# Patient Record
Sex: Female | Born: 1975 | Race: White | Hispanic: No | Marital: Single | Smoking: Current every day smoker
Health system: Southern US, Community
[De-identification: ages and names within clinical notes are randomized; demographics above are authoritative.]

## PROBLEM LIST (undated history)

## (undated) DIAGNOSIS — I10 Essential (primary) hypertension: Secondary | ICD-10-CM

## (undated) DIAGNOSIS — R569 Unspecified convulsions: Secondary | ICD-10-CM

---

## 2005-12-14 ENCOUNTER — Emergency Department: Payer: Self-pay | Admitting: Emergency Medicine

## 2007-01-16 ENCOUNTER — Emergency Department: Payer: Self-pay | Admitting: Emergency Medicine

## 2008-10-06 ENCOUNTER — Encounter: Payer: Self-pay | Admitting: Obstetrics and Gynecology

## 2008-10-14 ENCOUNTER — Inpatient Hospital Stay: Payer: Self-pay | Admitting: Obstetrics and Gynecology

## 2008-12-06 ENCOUNTER — Emergency Department: Payer: Self-pay | Admitting: Emergency Medicine

## 2010-10-30 IMAGING — US US OB DETAIL+14 WK - NRPT [facility]
1 series · 14 of 28 positions shown · non-contrast
Comparison: none

[Series 1: us ob detail+14 wk - nrpt [facility] · 14 of 91 slices shown]
[im 4/91]
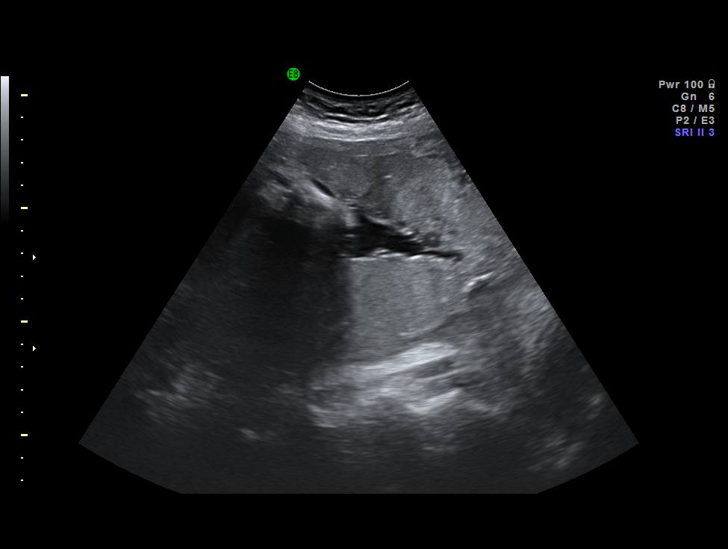
[im 11/91]
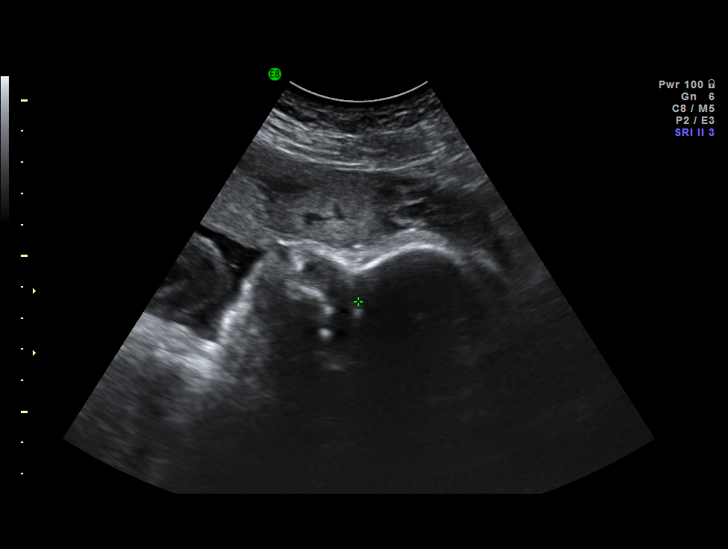
[im 17/91]
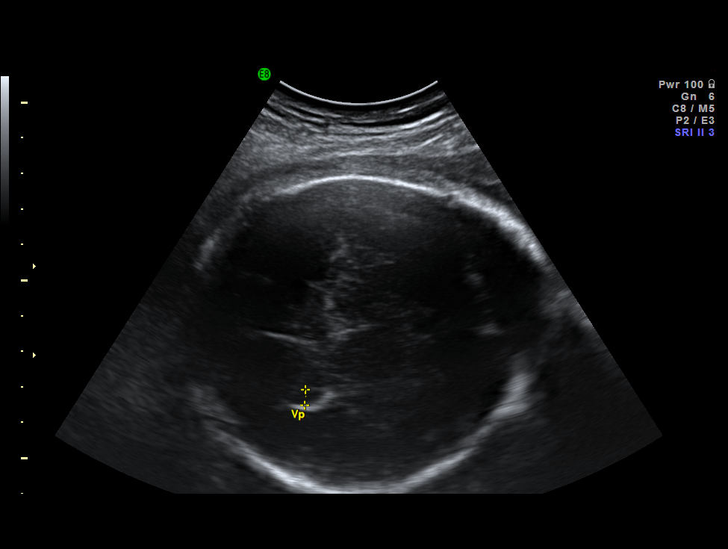
[im 24/91]
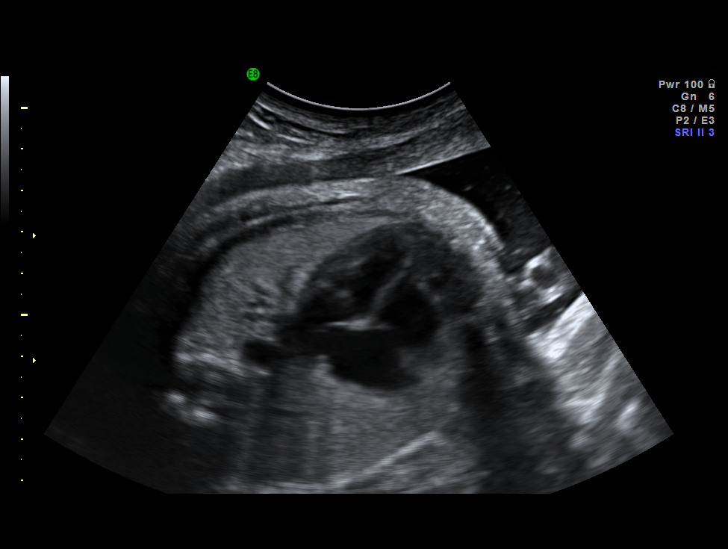
[im 31/91]
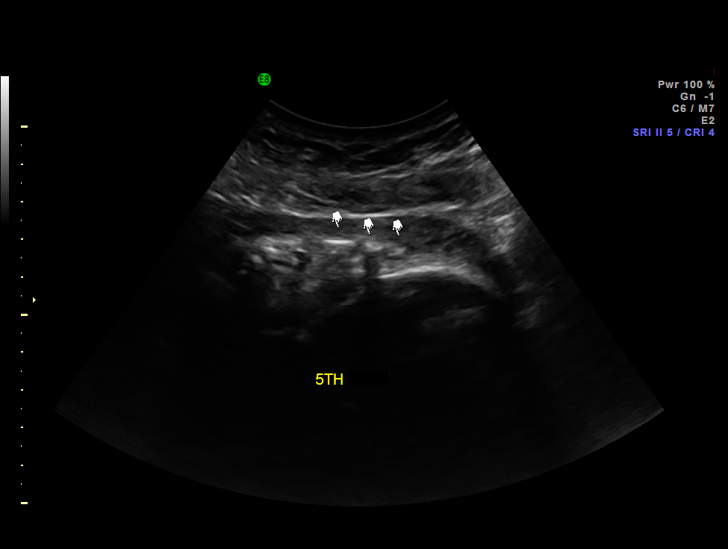
[im 37/91]
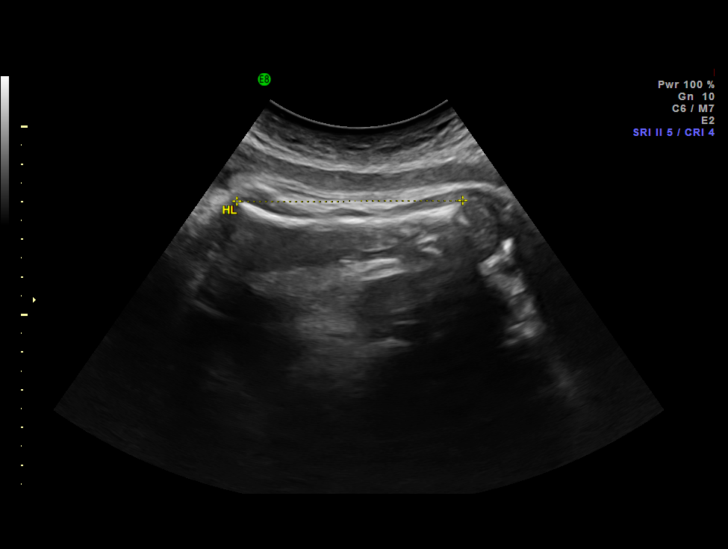
[im 44/91]
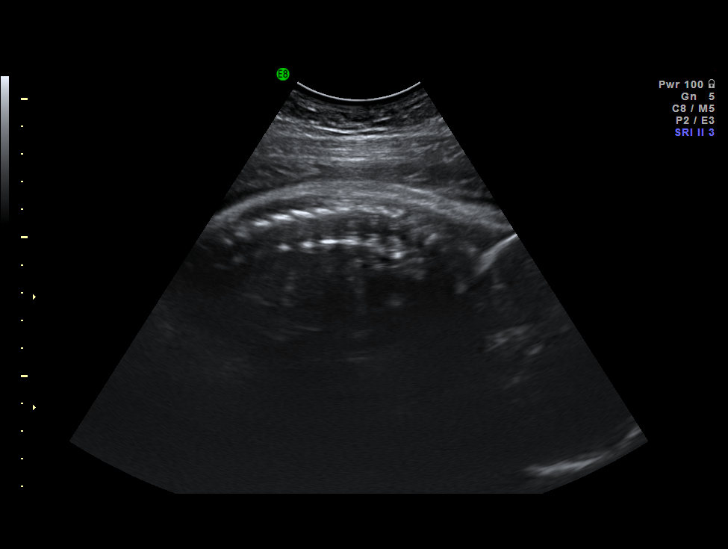
[im 51/91]
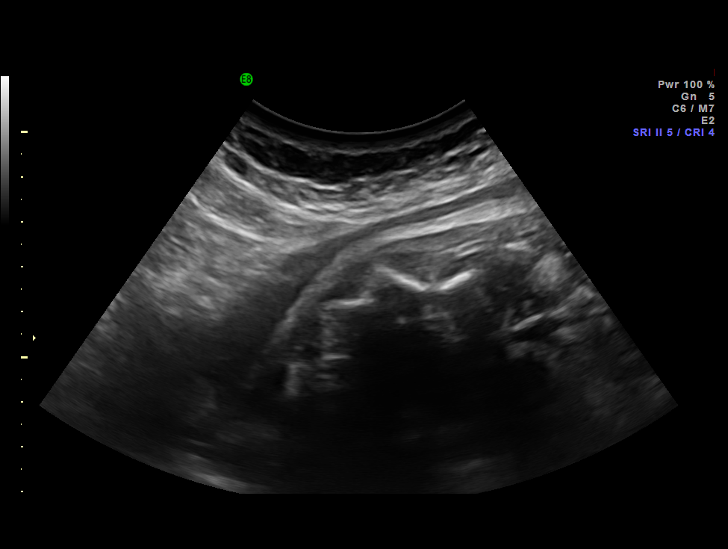
[im 57/91]
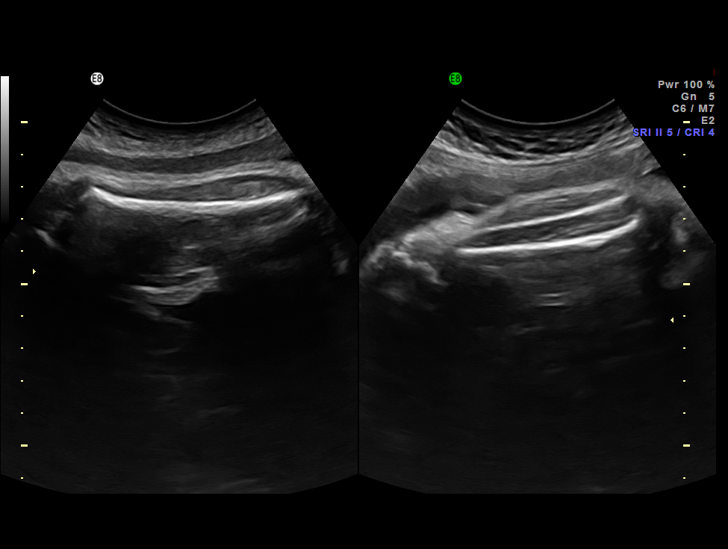
[im 64/91]
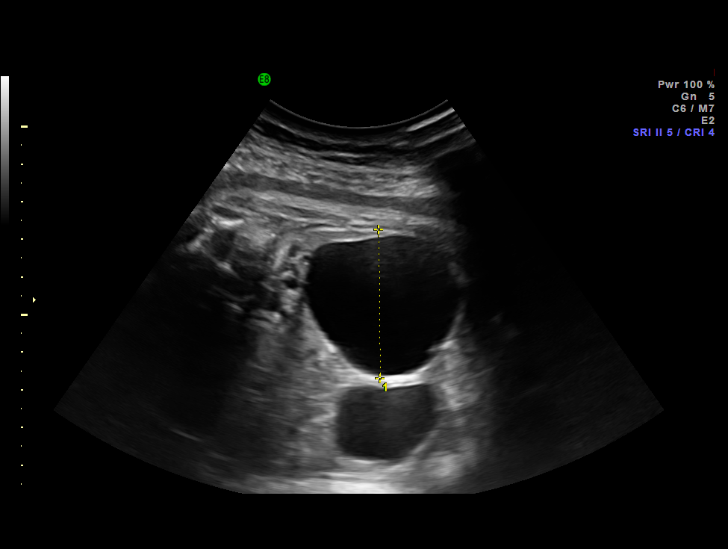
[im 71/91]
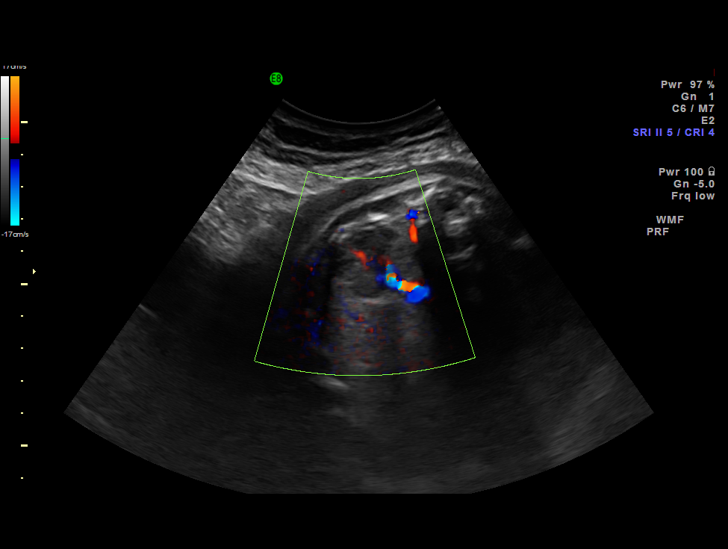
[im 77/91]
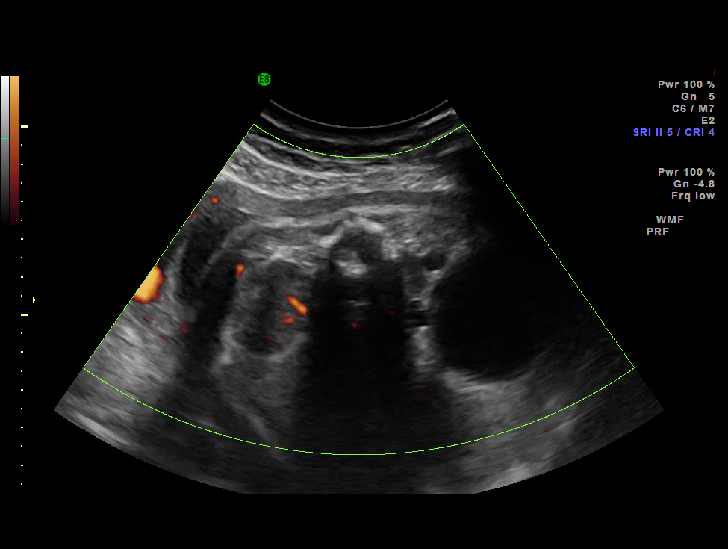
[im 84/91]
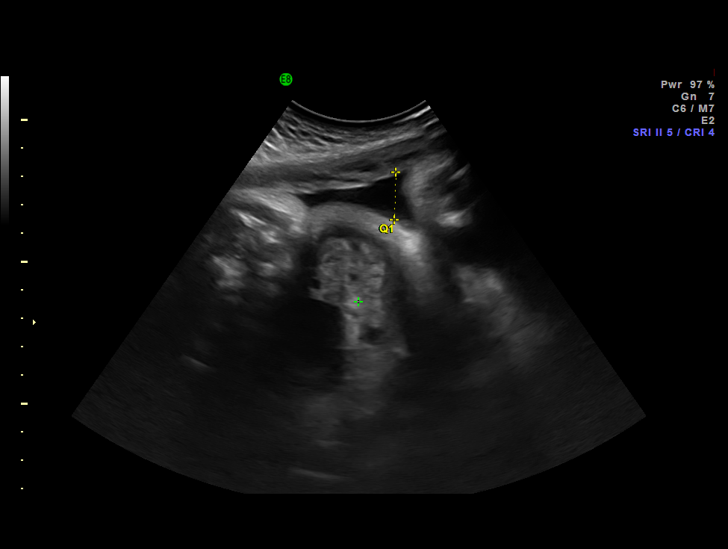
[im 91/91]
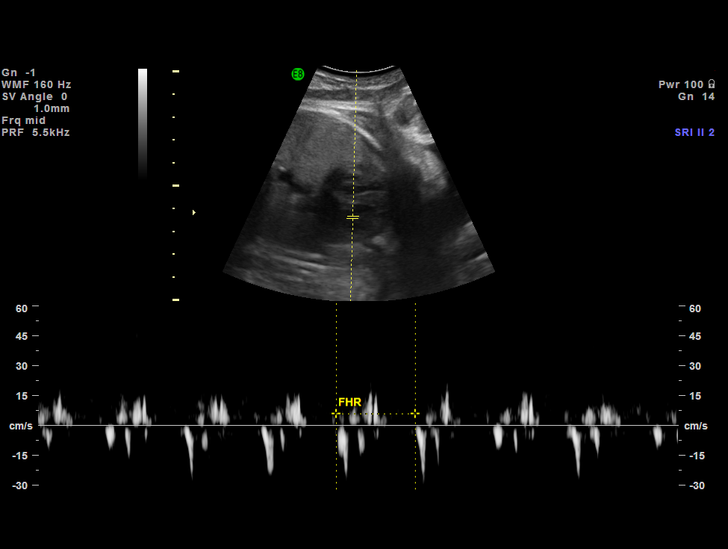

[14 of 28 positions shown; findings below may reference images not displayed]

IMAGES IMPORTED FROM THE [employer] WORKFLOW SYSTEM
NO DICTATION FOR STUDY

## 2014-10-22 ENCOUNTER — Emergency Department: Payer: No Typology Code available for payment source

## 2014-10-22 ENCOUNTER — Emergency Department
Admission: EM | Admit: 2014-10-22 | Discharge: 2014-10-22 | Disposition: A | Discharge status: Home / Self Care | Payer: No Typology Code available for payment source | Attending: Emergency Medicine | Admitting: Emergency Medicine

## 2014-10-22 ENCOUNTER — Encounter: Payer: Self-pay | Admitting: *Deleted

## 2014-10-22 DIAGNOSIS — Y92410 Unspecified street and highway as the place of occurrence of the external cause: Secondary | ICD-10-CM | POA: Insufficient documentation

## 2014-10-22 DIAGNOSIS — Y9389 Activity, other specified: Secondary | ICD-10-CM | POA: Insufficient documentation

## 2014-10-22 DIAGNOSIS — M7918 Myalgia, other site: Secondary | ICD-10-CM

## 2014-10-22 DIAGNOSIS — S4991XA Unspecified injury of right shoulder and upper arm, initial encounter: Secondary | ICD-10-CM | POA: Diagnosis not present

## 2014-10-22 DIAGNOSIS — I10 Essential (primary) hypertension: Secondary | ICD-10-CM | POA: Diagnosis not present

## 2014-10-22 DIAGNOSIS — S299XXA Unspecified injury of thorax, initial encounter: Secondary | ICD-10-CM | POA: Insufficient documentation

## 2014-10-22 DIAGNOSIS — Z720 Tobacco use: Secondary | ICD-10-CM | POA: Diagnosis not present

## 2014-10-22 DIAGNOSIS — Y998 Other external cause status: Secondary | ICD-10-CM | POA: Insufficient documentation

## 2014-10-22 HISTORY — DX: Unspecified convulsions: R56.9

## 2014-10-22 HISTORY — DX: Essential (primary) hypertension: I10

## 2014-10-22 MED ORDER — TRAMADOL HCL 50 MG PO TABS: 50.0000 mg | ORAL_TABLET | Freq: Four times a day (QID) | ORAL | Status: AC | PRN

## 2014-10-22 MED ORDER — HYDROCODONE-ACETAMINOPHEN 5-325 MG PO TABS
1.0000 | ORAL_TABLET | Freq: Once | ORAL | Status: AC
  Administered 2014-10-22: 1 via ORAL
  Filled 2014-10-22: qty 1

## 2014-10-22 MED ORDER — CYCLOBENZAPRINE HCL 10 MG PO TABS
10.0000 mg | ORAL_TABLET | Freq: Once | ORAL | Status: AC
  Administered 2014-10-22: 10 mg via ORAL
  Filled 2014-10-22: qty 1

## 2014-10-22 MED ORDER — CYCLOBENZAPRINE HCL 10 MG PO TABS: 10.0000 mg | ORAL_TABLET | Freq: Three times a day (TID) | ORAL | Status: AC | PRN
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
## 2014-10-22 NOTE — Discharge Instructions (Signed)
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
